# Patient Record
Sex: Female | Born: 1980 | Race: Black or African American | Hispanic: No | Marital: Single | Smoking: Never smoker
Health system: Southern US, Community
[De-identification: ages and names within clinical notes are randomized; demographics above are authoritative.]

## PROBLEM LIST (undated history)

## (undated) DIAGNOSIS — A6000 Herpesviral infection of urogenital system, unspecified: Secondary | ICD-10-CM

## (undated) HISTORY — PX: OTHER SURGICAL HISTORY: SHX169

---

## 2005-11-14 ENCOUNTER — Ambulatory Visit (HOSPITAL_COMMUNITY): Admission: RE | Admit: 2005-11-14 | Discharge: 2005-11-14 | Payer: Self-pay | Admitting: Gastroenterology

## 2006-01-19 ENCOUNTER — Emergency Department (HOSPITAL_COMMUNITY): Admission: EM | Admit: 2006-01-19 | Discharge: 2006-01-19 | Payer: Self-pay | Admitting: Emergency Medicine

## 2006-07-09 ENCOUNTER — Emergency Department (HOSPITAL_COMMUNITY): Admission: EM | Admit: 2006-07-09 | Discharge: 2006-07-09 | Payer: Self-pay | Admitting: Emergency Medicine

## 2007-12-27 ENCOUNTER — Emergency Department (HOSPITAL_COMMUNITY): Admission: EM | Admit: 2007-12-27 | Discharge: 2007-12-27 | Payer: Self-pay | Admitting: Emergency Medicine

## 2012-12-20 ENCOUNTER — Encounter: Payer: Self-pay | Admitting: Emergency Medicine

## 2012-12-20 ENCOUNTER — Emergency Department (INDEPENDENT_AMBULATORY_CARE_PROVIDER_SITE_OTHER)
Admission: EM | Admit: 2012-12-20 | Discharge: 2012-12-20 | Disposition: A | Discharge status: Home / Self Care | Payer: BC Managed Care – PPO | Source: Home / Self Care | Attending: Family Medicine | Admitting: Family Medicine

## 2012-12-20 DIAGNOSIS — M940 Chondrocostal junction syndrome [Tietze]: Secondary | ICD-10-CM

## 2012-12-20 DIAGNOSIS — R079 Chest pain, unspecified: Secondary | ICD-10-CM

## 2012-12-20 HISTORY — DX: Herpesviral infection of urogenital system, unspecified: A60.00

## 2012-12-20 MED ORDER — MELOXICAM 15 MG PO TABS: 15.0000 mg | ORAL_TABLET | Freq: Every day | ORAL | Status: AC

## 2012-12-20 NOTE — ED Notes (Addendum)
Pt c/o intermittent center of chest pain x 11 months, worse x . She also reports some LT arm soreness and SOB at night. Denies nausea.

## 2012-12-20 NOTE — ED Provider Notes (Signed)
CSN: [id number]     Arrival date & time 12/20/12  1478 History   First MD Initiated Contact with Patient 12/20/12 [phone]     Chief Complaint  Patient presents with  . Chest Pain      HPI Comments: Patient complains of constant pain over her sternum that started about a year ago; the pain sometimes radiates to her left arm.  She recalls no preceding trauma or change in physical activities.  About two months ago she developed a cold-like illness that lasted about two weeks before resolving, and her chest pain was somewhat worse then.  In late October she developed another respiratory infection that resolved after being prescribed Azithromycin.  She has had a normal chest X-ray and EKG during an ER visit for her chest pain.  The pain does not awaken her at night, and is less noticeable when she is prone.  She does not have chest pain with activity.  She does not have reflux symptoms.  Patient is a 32 y.o. female presenting with chest pain. The history is provided by the patient.  Chest Pain Chest pain location: sternum. Pain quality: aching   Pain radiates to:  L arm Pain radiates to the back: no   Pain severity:  Mild Onset quality:  Gradual Duration:  12 minutes Timing:  Constant Progression:  Unchanged Chronicity:  Chronic Context: lifting, movement and at rest   Context: not breathing, no drug use, not eating, not raising an arm and no trauma   Relieved by:  Nothing Worsened by:  Movement Ineffective treatments: cough medicine. Associated symptoms: no abdominal pain, no AICD problem, no anorexia, no anxiety, no cough, no diaphoresis, no dysphagia, no fatigue, no fever, no headache, no heartburn, no lower extremity edema, no nausea, no numbness, no palpitations, no shortness of breath and no weakness     Past Medical History  Diagnosis Date  . Genital herpes    Past Surgical History  Procedure Laterality Date  . Rt thumb surgery     Family History  Problem Relation Age of Onset   . Heart failure Mother    History  Substance Use Topics  . Smoking status: Never Smoker   . Smokeless tobacco: Never Used  . Alcohol Use: No   OB History   Grav Para Term Preterm Abortions TAB SAB Ect Mult Living                 Review of Systems  Constitutional: Negative for fever, diaphoresis and fatigue.  HENT: Negative for trouble swallowing.   Respiratory: Negative for cough and shortness of breath.   Cardiovascular: Positive for chest pain. Negative for palpitations.  Gastrointestinal: Negative for heartburn, nausea, abdominal pain and anorexia.  Neurological: Negative for weakness, numbness and headaches.    Allergies  Review of patient's allergies indicates no known allergies.  Home Medications   Current Outpatient Rx  Name  Route  Sig  Dispense  Refill  . Multiple Vitamins-Minerals (HAIR/SKIN/NAILS PO)   Oral   Take by mouth.         . valACYclovir (VALTREX) 1000 MG tablet   Oral   Take 500 mg by mouth 2 (two) times daily.         . meloxicam (MOBIC) 15 MG tablet   Oral   Take 1 tablet (15 mg total) by mouth daily. Take with food each morning   15 tablet   0    BP 99/63  Pulse 88  Temp(Src) 99.1 F (37.3  C) (Oral)  Resp 18  SpO2 98%  LMP 12/06/2012 Physical Exam Nursing notes and Vital Signs reviewed. Appearance:  Patient appears healthy, stated age, and in no acute distress Eyes:  Pupils are equal, round, and reactive to light and accomodation.  Extraocular movement is intact.  Conjunctivae are not inflamed  Ears:  Canals normal.  Tympanic membranes normal.  Nose:   Normal turbinates.  No sinus tenderness.   Mouth:  Normal  Pharynx:  Normal Neck:  Supple.  No adenopathy Lungs:  Clear to auscultation.  Breath sounds are equal.  Chest:  Distinct tenderness to palpation over the mid-sternum (only when sitting, not supine) Heart:  Regular rate and rhythm without murmurs, rubs, or gallops.  Abdomen:  Nontender without masses or  hepatosplenomegaly.  Bowel sounds are present.  No CVA or flank tenderness.  Extremities:  No edema.  No calf tenderness Skin:  No rash present.   ED Course  Procedures  none       EKG Interpretation    Date/Time:  12/20/12   09:43:57    Ventricular Rate:  81   PR Interval:  0.154    QRS Duration: 0.94   QT Interval: 0.362    QTC Calculation: 0.398   R Axis:  65 degrees    Text Interpretation:  Normal              MDM   1. Costochondritis    Begin Mobic Try applying ice pack first; if not improvement try heating pad two or three times daily. Followup with Dr. Rodney Langton in approximately 10 days.    Lattie Haw, MD 12/20/12 [phone]

## 2012-12-31 ENCOUNTER — Institutional Professional Consult (permissible substitution): Payer: BC Managed Care – PPO | Admitting: Sports Medicine

## 2013-12-12 ENCOUNTER — Emergency Department (HOSPITAL_COMMUNITY)
Admission: EM | Admit: 2013-12-12 | Discharge: 2013-12-12 | Disposition: A | Discharge status: Home / Self Care | Payer: BC Managed Care – PPO | Attending: Emergency Medicine | Admitting: Emergency Medicine

## 2013-12-12 ENCOUNTER — Emergency Department (HOSPITAL_COMMUNITY): Payer: BC Managed Care – PPO

## 2013-12-12 ENCOUNTER — Encounter (HOSPITAL_COMMUNITY): Payer: Self-pay | Admitting: Emergency Medicine

## 2013-12-12 DIAGNOSIS — S199XXA Unspecified injury of neck, initial encounter: Secondary | ICD-10-CM | POA: Diagnosis present

## 2013-12-12 DIAGNOSIS — Y92410 Unspecified street and highway as the place of occurrence of the external cause: Secondary | ICD-10-CM | POA: Diagnosis not present

## 2013-12-12 DIAGNOSIS — S161XXA Strain of muscle, fascia and tendon at neck level, initial encounter: Secondary | ICD-10-CM | POA: Diagnosis not present

## 2013-12-12 DIAGNOSIS — Z79899 Other long term (current) drug therapy: Secondary | ICD-10-CM | POA: Diagnosis not present

## 2013-12-12 DIAGNOSIS — M62838 Other muscle spasm: Secondary | ICD-10-CM | POA: Diagnosis not present

## 2013-12-12 DIAGNOSIS — Z791 Long term (current) use of non-steroidal anti-inflammatories (NSAID): Secondary | ICD-10-CM | POA: Diagnosis not present

## 2013-12-12 DIAGNOSIS — Y9389 Activity, other specified: Secondary | ICD-10-CM | POA: Diagnosis not present

## 2013-12-12 DIAGNOSIS — Z8619 Personal history of other infectious and parasitic diseases: Secondary | ICD-10-CM | POA: Diagnosis not present

## 2013-12-12 DIAGNOSIS — Y998 Other external cause status: Secondary | ICD-10-CM | POA: Diagnosis not present

## 2013-12-12 MED ORDER — HYDROCODONE-ACETAMINOPHEN 5-325 MG PO TABS: 1.0000 | ORAL_TABLET | Freq: Four times a day (QID) | ORAL | Status: DC | PRN

## 2013-12-12 MED ORDER — HYDROCODONE-ACETAMINOPHEN 5-325 MG PO TABS: 1.0000 | ORAL_TABLET | Freq: Four times a day (QID) | ORAL | Status: AC | PRN

## 2013-12-12 MED ORDER — CYCLOBENZAPRINE HCL 10 MG PO TABS: 10.0000 mg | ORAL_TABLET | Freq: Three times a day (TID) | ORAL | Status: DC | PRN

## 2013-12-12 MED ORDER — NAPROXEN 500 MG PO TABS: 500.0000 mg | ORAL_TABLET | Freq: Two times a day (BID) | ORAL | Status: DC | PRN

## 2013-12-12 MED ORDER — CYCLOBENZAPRINE HCL 10 MG PO TABS: 10.0000 mg | ORAL_TABLET | Freq: Three times a day (TID) | ORAL | Status: AC | PRN

## 2013-12-12 MED ORDER — NAPROXEN 500 MG PO TABS: 500.0000 mg | ORAL_TABLET | Freq: Two times a day (BID) | ORAL | Status: AC | PRN

## 2013-12-12 NOTE — ED Notes (Signed)
Pt given coke and ice for neck. RN aware

## 2013-12-12 NOTE — ED Notes (Signed)
LSB and head blocks removed. Cervical collar remains on. Patienat c/o posterior cervical neck pain when palpated.
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
